# Patient Record
Sex: Male | Born: 1975 | Race: White | Hispanic: No | Marital: Single | Smoking: Never smoker
Health system: Southern US, Community
[De-identification: ages and names within clinical notes are randomized; demographics above are authoritative.]

---

## 2003-06-18 ENCOUNTER — Emergency Department (HOSPITAL_COMMUNITY): Admission: EM | Admit: 2003-06-18 | Discharge: 2003-06-18 | Payer: Self-pay | Admitting: Emergency Medicine

## 2013-04-08 ENCOUNTER — Emergency Department (HOSPITAL_BASED_OUTPATIENT_CLINIC_OR_DEPARTMENT_OTHER): Payer: Managed Care, Other (non HMO)

## 2013-04-08 ENCOUNTER — Encounter (HOSPITAL_BASED_OUTPATIENT_CLINIC_OR_DEPARTMENT_OTHER): Payer: Self-pay

## 2013-04-08 ENCOUNTER — Emergency Department (HOSPITAL_BASED_OUTPATIENT_CLINIC_OR_DEPARTMENT_OTHER)
Admission: EM | Admit: 2013-04-08 | Discharge: 2013-04-08 | Disposition: A | Discharge status: Home / Self Care | Payer: Managed Care, Other (non HMO) | Attending: Emergency Medicine | Admitting: Emergency Medicine

## 2013-04-08 DIAGNOSIS — R61 Generalized hyperhidrosis: Secondary | ICD-10-CM | POA: Insufficient documentation

## 2013-04-08 DIAGNOSIS — R55 Syncope and collapse: Secondary | ICD-10-CM | POA: Insufficient documentation

## 2013-04-08 DIAGNOSIS — R42 Dizziness and giddiness: Secondary | ICD-10-CM | POA: Insufficient documentation

## 2013-04-08 DIAGNOSIS — R110 Nausea: Secondary | ICD-10-CM | POA: Insufficient documentation

## 2013-04-08 DIAGNOSIS — R404 Transient alteration of awareness: Secondary | ICD-10-CM | POA: Insufficient documentation

## 2013-04-08 LAB — CBC WITH DIFFERENTIAL/PLATELET
Basophils Relative: 1 % (ref 0–1)
HCT: 44.4 % (ref 39.0–52.0)
Hemoglobin: 15.5 g/dL (ref 13.0–17.0)
Lymphs Abs: 1.9 10*3/uL (ref 0.7–4.0)
MCHC: 34.9 g/dL (ref 30.0–36.0)
Monocytes Absolute: 0.6 10*3/uL (ref 0.1–1.0)
Monocytes Relative: 6 % (ref 3–12)
Neutro Abs: 6.8 10*3/uL (ref 1.7–7.7)
RBC: 5 MIL/uL (ref 4.22–5.81)

## 2013-04-08 LAB — BASIC METABOLIC PANEL
BUN: 15 mg/dL (ref 6–23)
CO2: 27 mEq/L (ref 19–32)
Chloride: 99 mEq/L (ref 96–112)
Creatinine, Ser: 1 mg/dL (ref 0.50–1.35)
GFR calc Af Amer: >90 mL/min (ref >90)
Glucose, Bld: 93 mg/dL (ref 70–99)

## 2013-04-08 LAB — RAPID URINE DRUG SCREEN, HOSP PERFORMED
Amphetamines: NOT DETECTED
Barbiturates: NOT DETECTED
Benzodiazepines: NOT DETECTED
Tetrahydrocannabinol: NOT DETECTED

## 2013-04-08 LAB — POCT I-STAT TROPONIN I: Troponin i, poc: 0 ng/mL (ref 0.00–0.08)

## 2013-04-08 MED ORDER — ONDANSETRON HCL 4 MG/2ML IJ SOLN
4.0000 mg | Freq: Once | INTRAMUSCULAR | Status: AC
  Administered 2013-04-08: 4 mg via INTRAVENOUS

## 2013-04-08 MED ORDER — ONDANSETRON HCL 4 MG/2ML IJ SOLN
INTRAMUSCULAR | Status: AC
  Filled 2013-04-08: qty 2

## 2013-04-08 MED ORDER — KETOROLAC TROMETHAMINE 30 MG/ML IJ SOLN
30.0000 mg | Freq: Once | INTRAMUSCULAR | Status: AC
  Administered 2013-04-08: 30 mg via INTRAVENOUS
  Filled 2013-04-08: qty 1

## 2013-04-08 NOTE — ED Notes (Signed)
Pt reports he developed a headache, nausea and had a 10 second syncopal episode while a work today.

## 2013-04-08 NOTE — ED Provider Notes (Signed)
History     CSN: [id number]  Arrival date & time 04/08/13  1258   First MD Initiated Contact with Patient 04/08/13 1309      Chief Complaint  Patient presents with  . Loss of Consciousness  . Headache    (Consider location/radiation/quality/duration/timing/severity/associated sxs/prior treatment) HPI Comments: Pt states that he was at work and got dizzy, nauseated and diaphoretic and he called a co-worker over and he was told by the co worker that he was out for about 10 seconds;no previous history of similar symptoms:pt states that he has chills not and still some nausea, but feels okay  Patient is a 37 y.o. male presenting with syncope. The history is provided by the patient. No language interpreter was used.  Loss of Consciousness Episode history:  Single Most recent episode:  Today Timing:  Intermittent Progression:  Resolved Chronicity:  New Context: sitting down   Witnessed: yes   Relieved by:  Nothing Worsened by:  Nothing tried Associated symptoms: diaphoresis, dizziness, headaches and nausea   Associated symptoms: no chest pain, no fever, no focal weakness and no shortness of breath     History reviewed. No pertinent past medical history.  History reviewed. No pertinent past surgical history.  No family history on file.  History  Substance Use Topics  . Smoking status: Never Smoker   . Smokeless tobacco: Not on file  . Alcohol Use: Yes     Comment: moderately      Review of Systems  Constitutional: Positive for diaphoresis. Negative for fever.  Respiratory: Negative for shortness of breath.   Cardiovascular: Positive for syncope. Negative for chest pain.  Gastrointestinal: Positive for nausea.  Neurological: Positive for dizziness and headaches. Negative for focal weakness.    Allergies  Review of patient's allergies indicates no known allergies.  Home Medications   Current Outpatient Rx  Name  Route  Sig  Dispense  Refill  . Desloratadine  (CLARINEX PO)   Oral   Take by mouth.         . Multiple Vitamins-Minerals (MULTIVITAMIN PO)   Oral   Take by mouth.           BP 134/87  Pulse 72  Temp(Src) 98.5 F (36.9 C) (Oral)  Resp 18  Ht 5\' 8"  (1.727 m)  Wt 168 lb (76.204 kg)  BMI 25.55 kg/m2  SpO2 100%  Physical Exam  Nursing note and vitals reviewed. Constitutional: He is oriented to person, place, and time. He appears well-developed and well-nourished.  HENT:  Head: Normocephalic and atraumatic.  Eyes: Conjunctivae and EOM are normal. Pupils are equal, round, and reactive to light.  Neck: Normal range of motion. Neck supple.  Cardiovascular: Normal rate and regular rhythm.   Pulmonary/Chest: Effort normal and breath sounds normal.  Abdominal: Soft. Bowel sounds are normal. There is no tenderness.  Musculoskeletal: Normal range of motion.  Neurological: He is alert and oriented to person, place, and time. Coordination normal.  Skin: Skin is warm and dry.  Psychiatric: He has a normal mood and affect.    ED Course  Procedures (including critical care time)  Labs Reviewed  CBC WITH DIFFERENTIAL  BASIC METABOLIC PANEL  URINE RAPID DRUG SCREEN (HOSP PERFORMED)  POCT I-STAT TROPONIN I   Dg Chest 2 View  04/08/2013   *RADIOLOGY REPORT*  Clinical Data: Syncope  CHEST - 2 VIEW  Comparison: None.  Findings: Lungs are essentially clear.  No focal consolidation. No pleural effusion or pneumothorax.  The heart is  normal in size.  Visualized osseous structures are within normal limits.  IMPRESSION: No evidence of acute cardiopulmonary disease.   Original Report Authenticated By: Charline Bills, M.D.    Date: 04/08/2013  Rate: 77  Rhythm: normal sinus rhythm  QRS Axis: normal  Intervals: normal  ST/T Wave abnormalities: normal  Conduction Disutrbances:none  Narrative Interpretation:   Old EKG Reviewed: none available   1. Syncope       MDM  Pt is feeling better at this time:think it is okay for pt to  go home        Teressa Lower, NP 04/08/13 1636  Hilario Quarry, MD 04/09/13 1101

## 2013-07-10 ENCOUNTER — Other Ambulatory Visit: Payer: Self-pay | Admitting: Physician Assistant

## 2013-07-10 ENCOUNTER — Ambulatory Visit
Admission: RE | Admit: 2013-07-10 | Discharge: 2013-07-10 | Disposition: A | Discharge status: Home / Self Care | Payer: Managed Care, Other (non HMO) | Source: Ambulatory Visit | Attending: Physician Assistant | Admitting: Physician Assistant

## 2013-07-10 DIAGNOSIS — M542 Cervicalgia: Secondary | ICD-10-CM

## 2014-06-24 IMAGING — CT CT HEAD W/O CM
1 series · 16 of 30 positions shown, 20 images · non-contrast
Comparison: None.

CLINICAL DATA: Headache, loss of consciousness

CT HEAD WITHOUT CONTRAST
TECHNIQUE: Contiguous axial images were obtained from the base of
the skull through the vertex without contrast

[Series 2: head 4.8 h37s · axial · 0.42mm/px · z∈[+1107,+1259]mm · 16 of 36 slices shown, 20 images]
[im 2/36  brain]
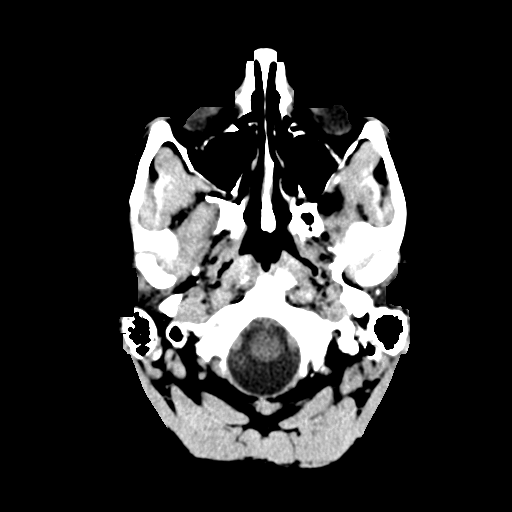
[im 2/36  bone]
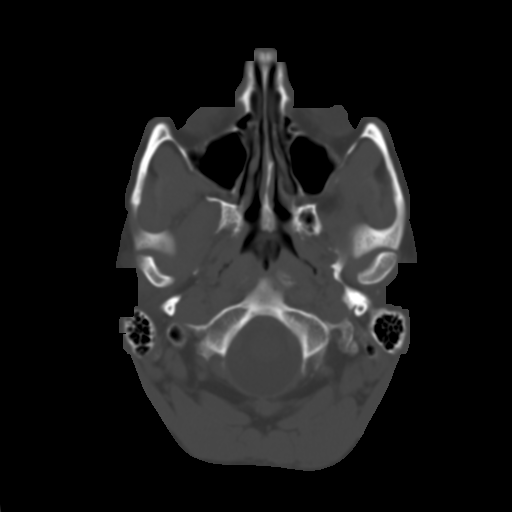
[im 4/36  brain]
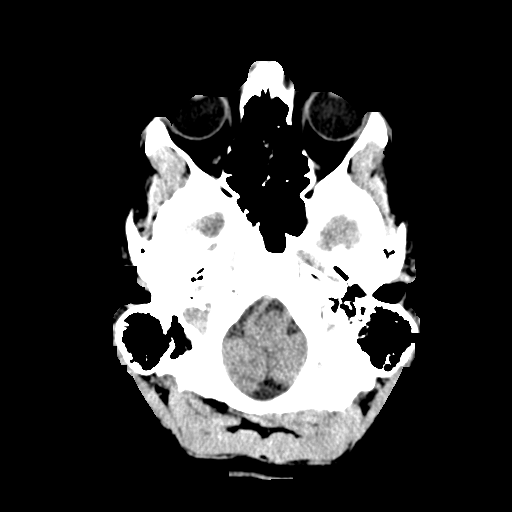
[im 7/36  brain]
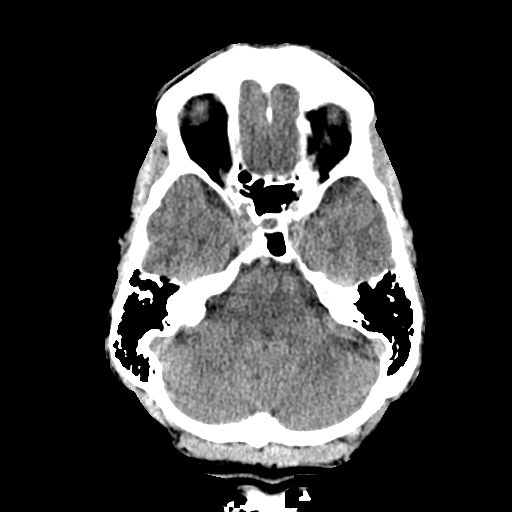
[im 9/36  brain]
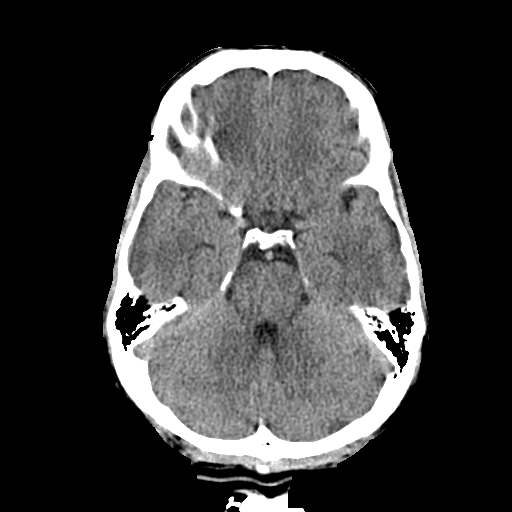
[im 10/36  brain]
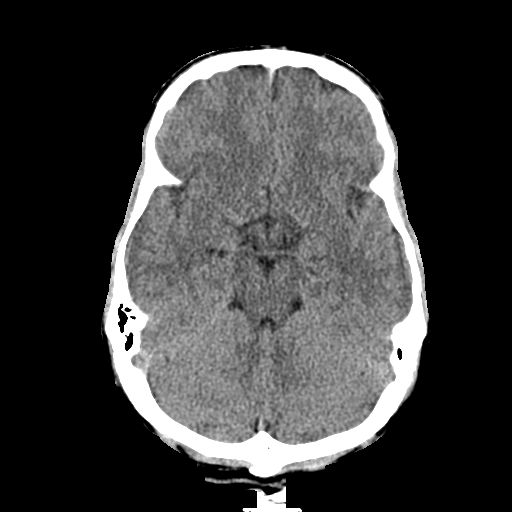
[im 10/36  bone]
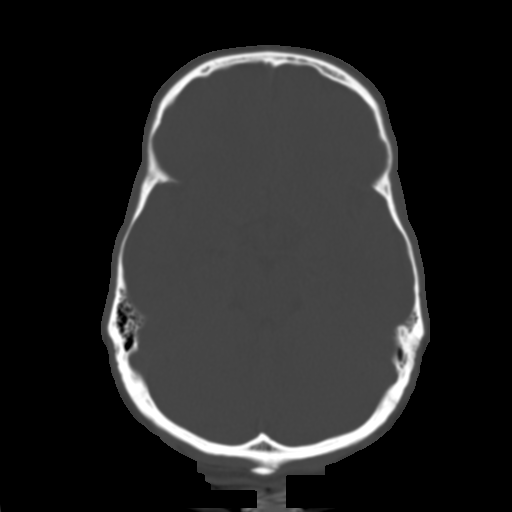
[im 13/36  brain]
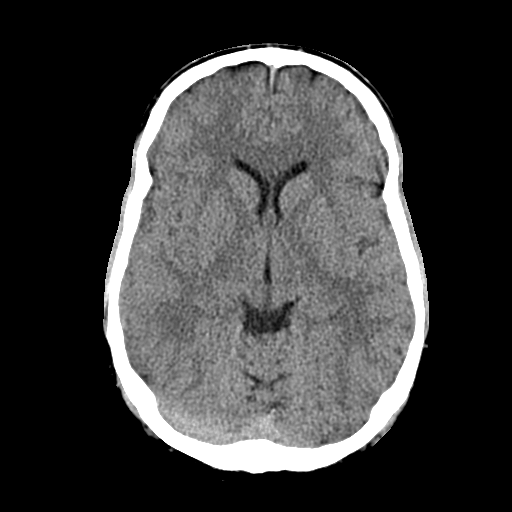
[im 15/36  brain]
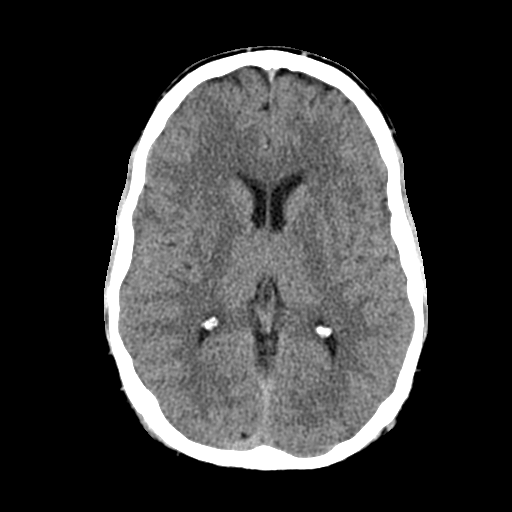
[im 17/36  brain]
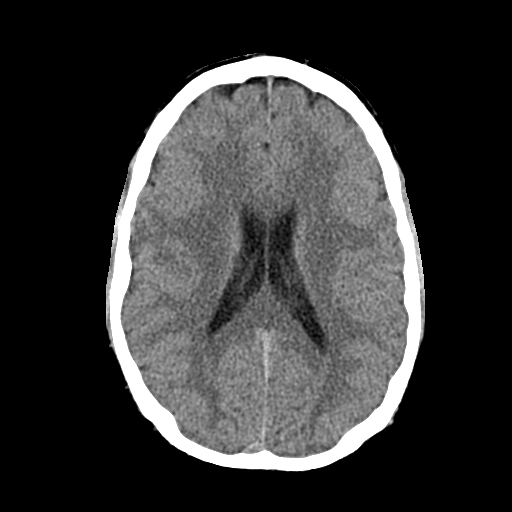
[im 19/36  brain]
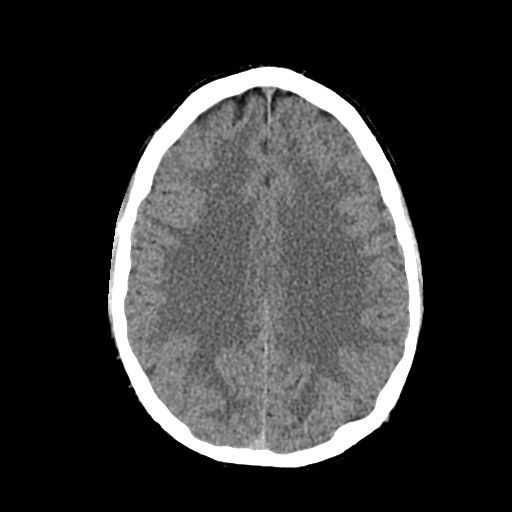
[im 19/36  bone]
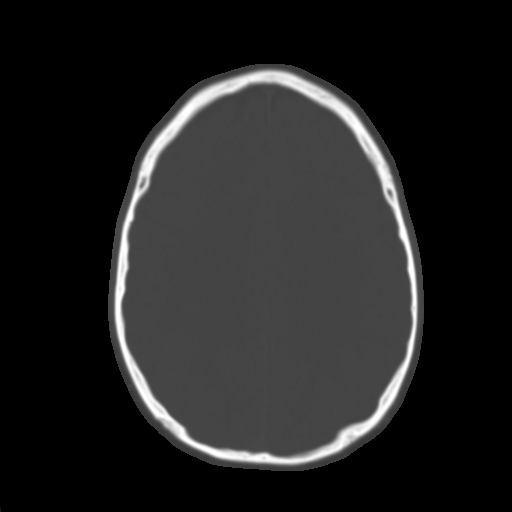
[im 21/36  brain]
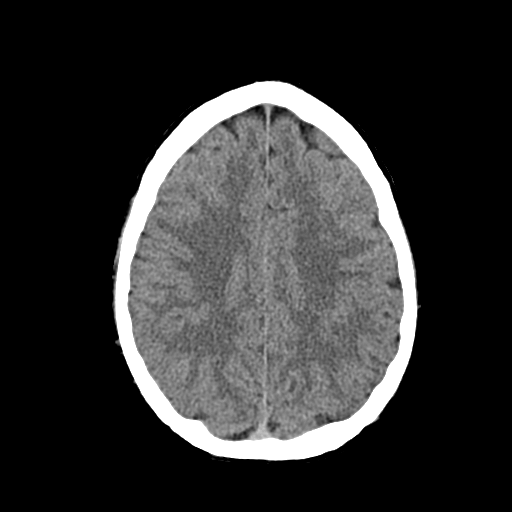
[im 23/36  brain]
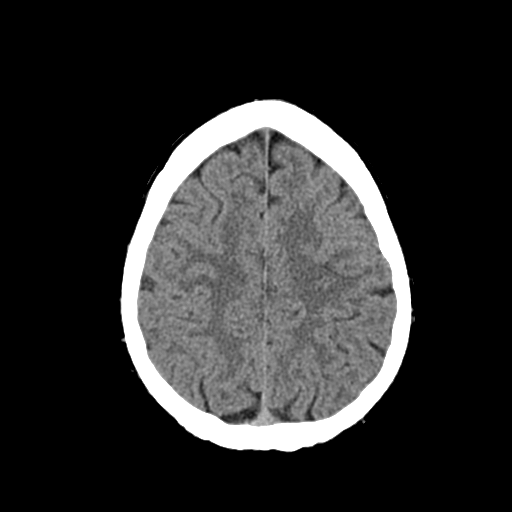
[im 26/36  brain]
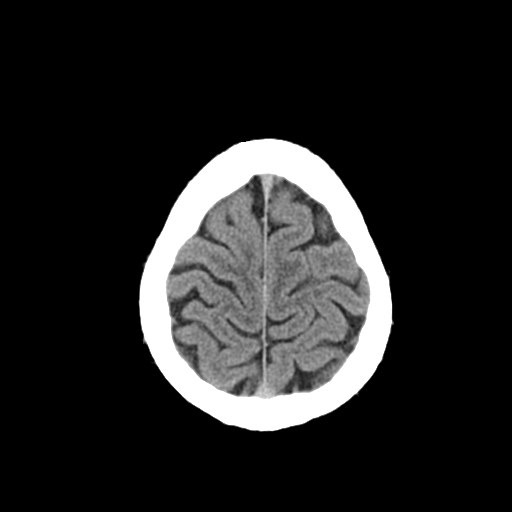
[im 27/36  brain]
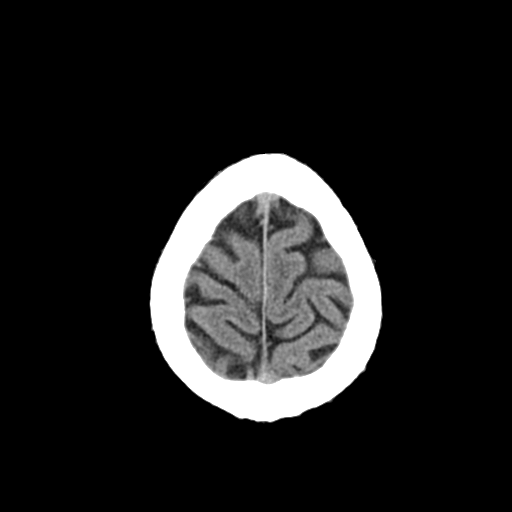
[im 27/36  bone]
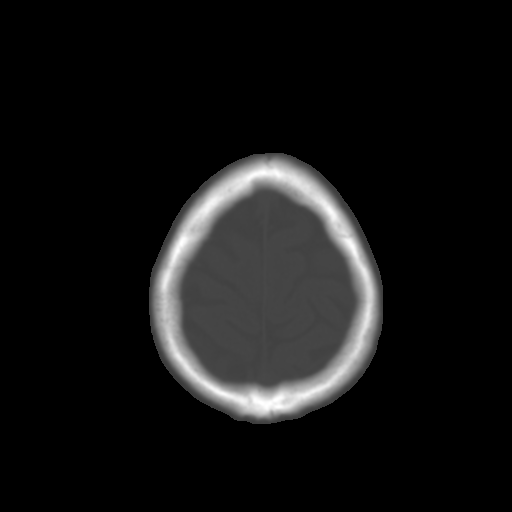
[im 29/36  brain]
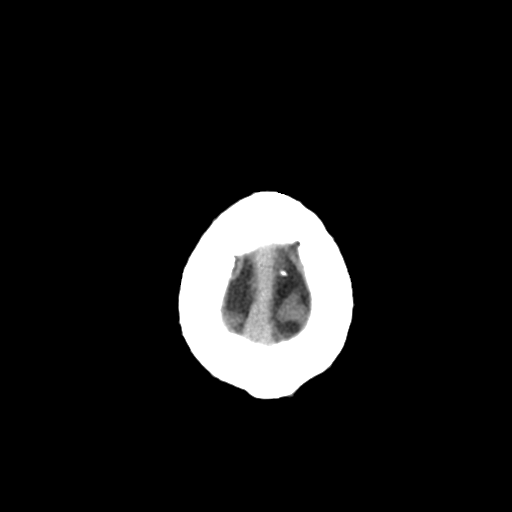
[im 32/36  brain]
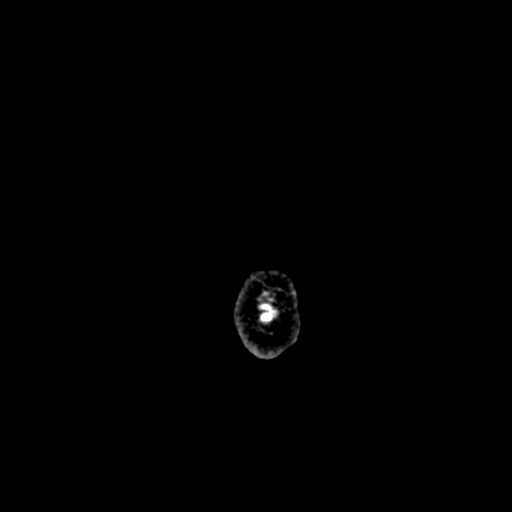
[im 34/36  brain]
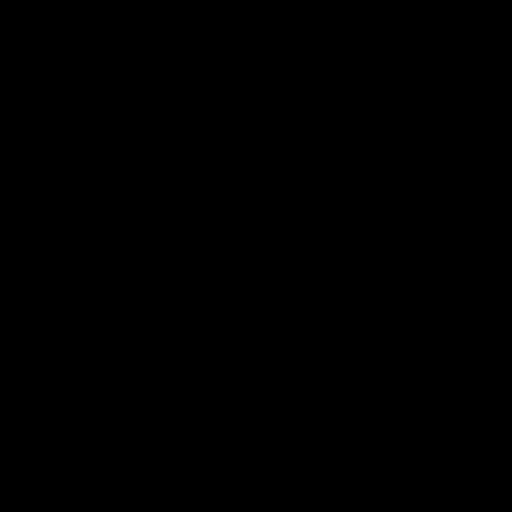

[16 of 30 positions shown; findings below may reference images not displayed]

FINDINGS: The brain has a normal appearance without evidence for
hemorrhage, acute infarction, hydrocephalus, or mass lesion.  There
is no extra axial fluid collection.  The skull and paranasal
sinuses are normal.
IMPRESSION: Normal CT of the head without contrast.

## 2014-06-24 IMAGING — CR DG CHEST 2V
2 series · 2 of 2 positions shown · non-contrast
Comparison: None.

CLINICAL DATA: Syncope

CHEST - 2 VIEW

[w chest pa]
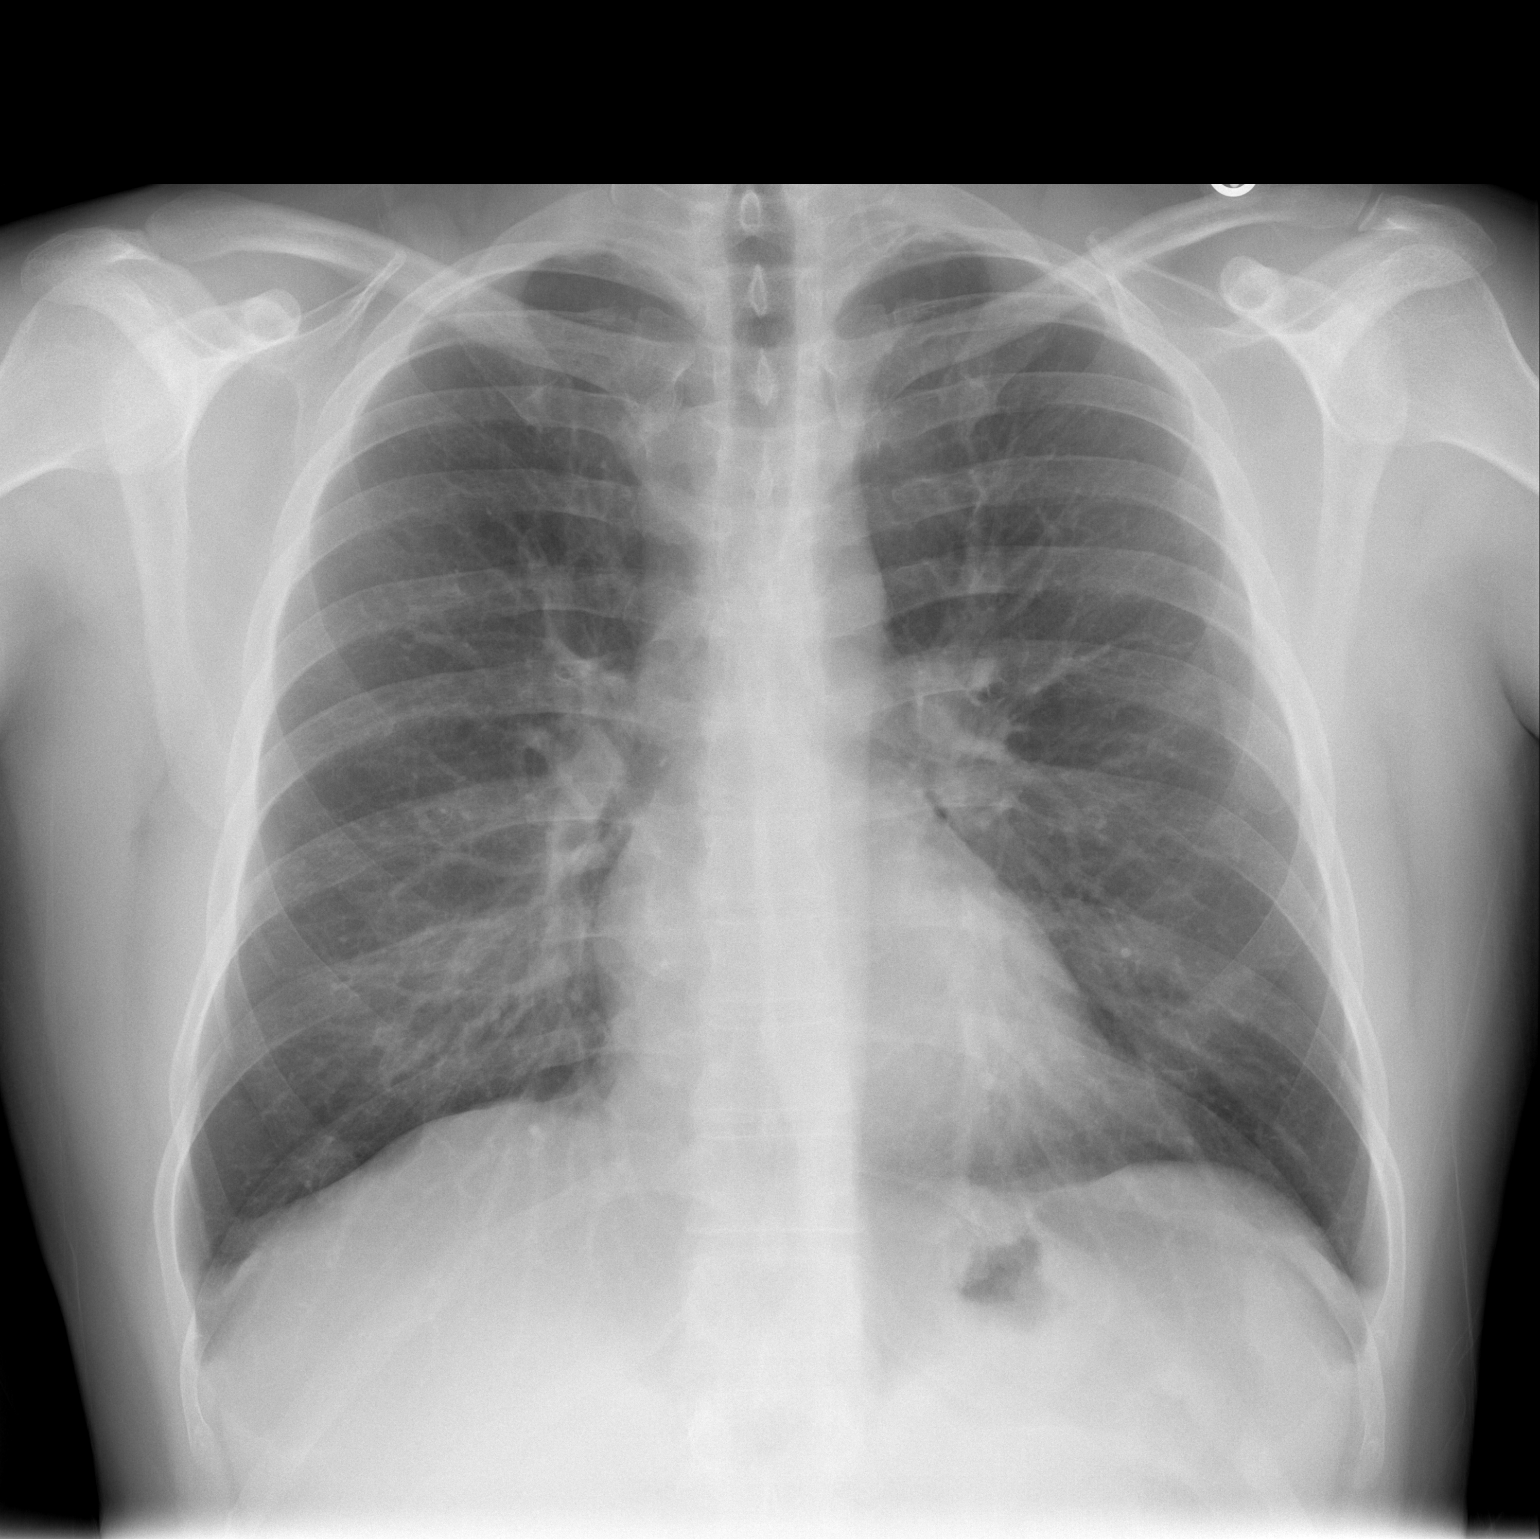

[w chest lat]
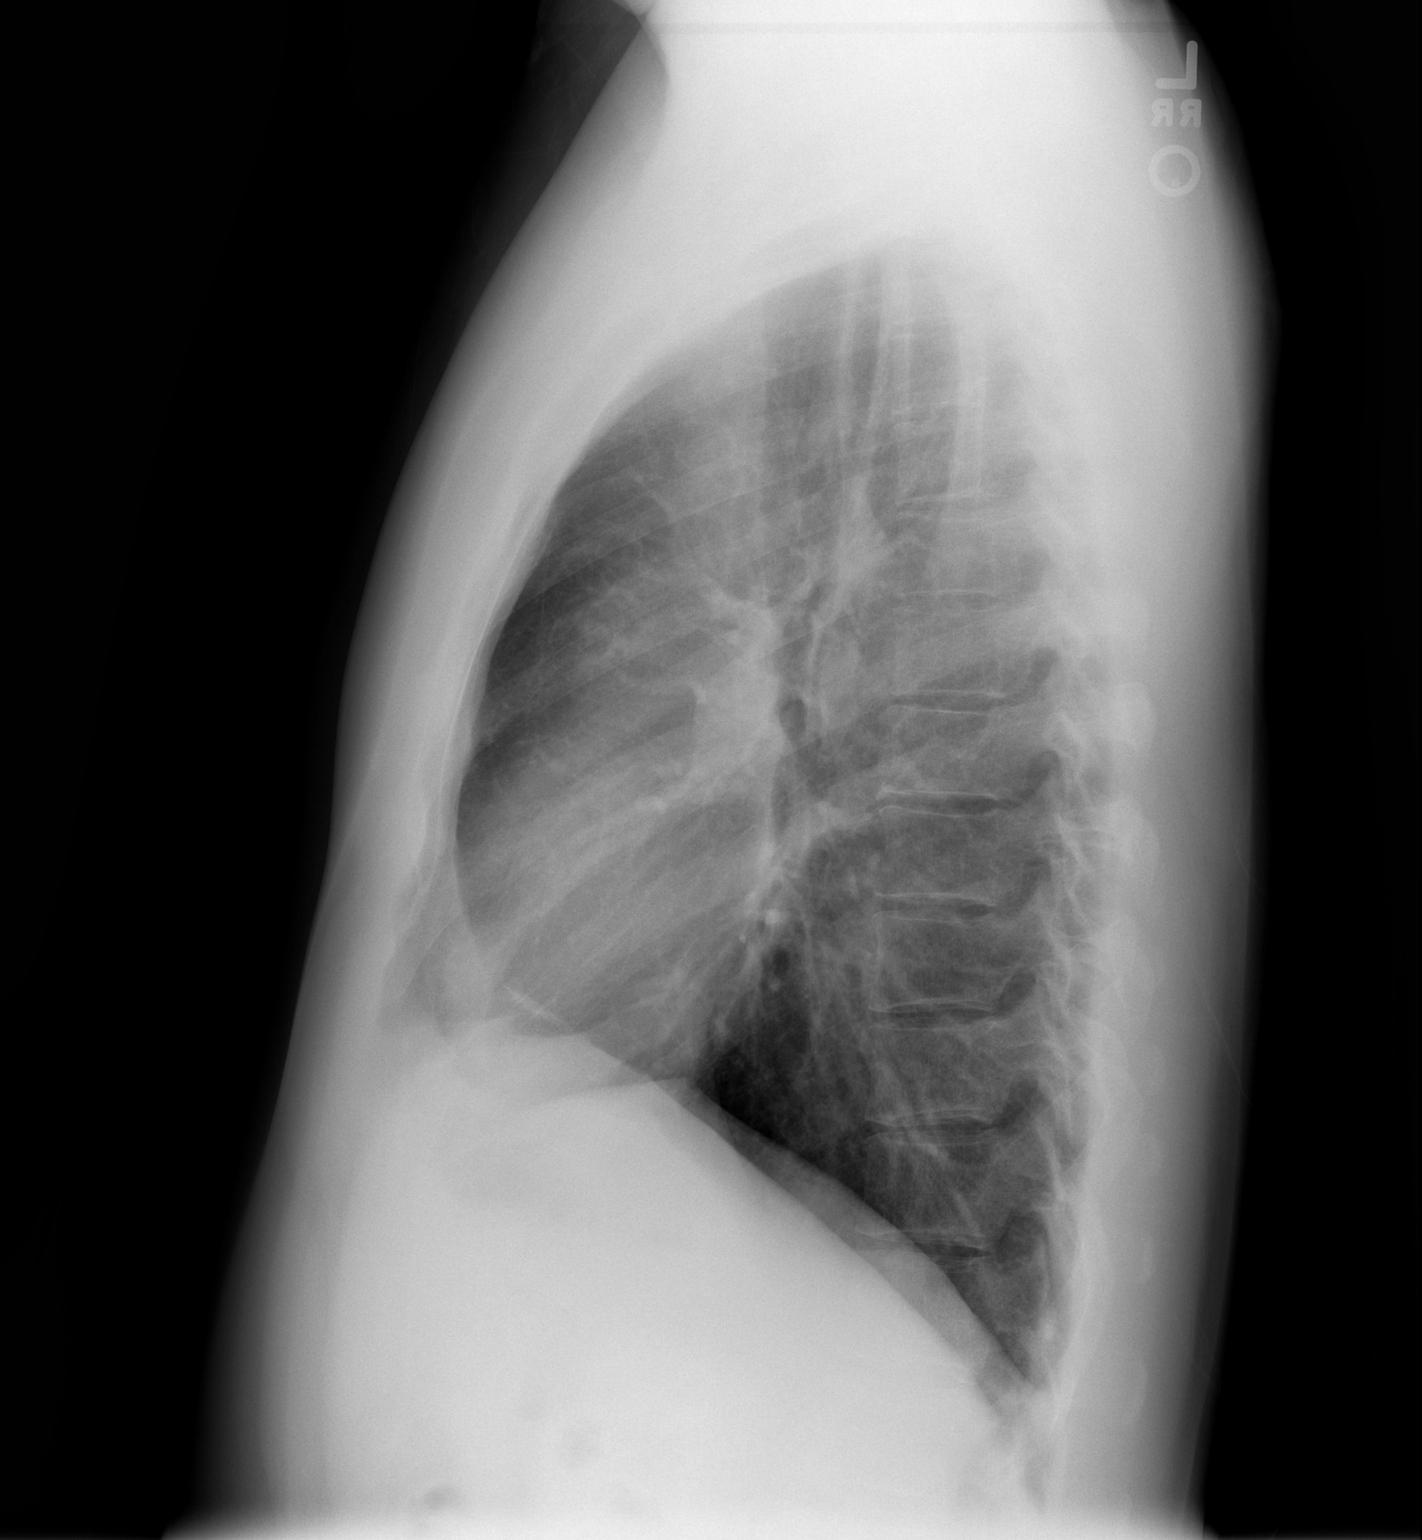

[2 of 2 positions shown; findings below may reference images not displayed]

FINDINGS: Lungs are essentially clear.  No focal consolidation. No
pleural effusion or pneumothorax.

The heart is normal in size.

Visualized osseous structures are within normal limits.
IMPRESSION: No evidence of acute cardiopulmonary disease.

## 2022-05-25 ENCOUNTER — Ambulatory Visit: Payer: 59 | Admitting: Podiatry

## 2022-05-25 ENCOUNTER — Ambulatory Visit (INDEPENDENT_AMBULATORY_CARE_PROVIDER_SITE_OTHER): Payer: 59

## 2022-05-25 DIAGNOSIS — M79674 Pain in right toe(s): Secondary | ICD-10-CM

## 2022-05-25 DIAGNOSIS — M79671 Pain in right foot: Secondary | ICD-10-CM

## 2022-05-25 DIAGNOSIS — G8929 Other chronic pain: Secondary | ICD-10-CM

## 2022-05-25 DIAGNOSIS — M2031 Hallux varus (acquired), right foot: Secondary | ICD-10-CM

## 2022-05-28 NOTE — Progress Notes (Signed)
Subjective:   Patient ID: Benjamin Greene, male   DOB: 46 y.o.   MRN: [id number]   HPI 46 year old male presents the office with concerns of right big toe swelling, discomfort.  This started about 1 and half years ago.  Since then not on the top of the big toe as well as the bottom of the big toe.  Denies any recent injury.  He had no recent treatment.   Review of Systems  All other systems reviewed and are negative.  No past medical history on file.  No past surgical history on file.   Current Outpatient Medications:    diazepam (VALIUM) 5 MG tablet, 1/2 - 1  tablet, Disp: , Rfl:    escitalopram (LEXAPRO) 10 MG tablet, 1 tablet, Disp: , Rfl:    Desloratadine (CLARINEX PO), Take by mouth., Disp: , Rfl:    ezetimibe (ZETIA) 10 MG tablet, 1 tablet, Disp: , Rfl:    loratadine (CLARITIN) 10 MG tablet, Take by mouth., Disp: , Rfl:    Melatonin 10 MG TABS, 1 tablet, Disp: , Rfl:    Multiple Vitamin (M.V.I. ADULT IV), , Disp: , Rfl:    Multiple Vitamins-Minerals (MULTIVITAMIN PO), Take by mouth., Disp: , Rfl:    naproxen sodium (ALEVE) 220 MG tablet, 1 tablet with food or milk as needed, Disp: , Rfl:    valACYclovir (VALTREX) 500 MG tablet, 1 tablet, Disp: , Rfl:   Allergies  Allergen Reactions   Atorvastatin Other (See Comments)   Citalopram Other (See Comments)   Crestor [Rosuvastatin] Other (See Comments)           Objective:  Physical Exam  General: AAO x3, NAD  Dermatological: Skin is warm, dry and supple bilateral. . There are no open sores, no preulcerative lesions, no rash or signs of infection present.  Vascular: Dorsalis Pedis artery and Posterior Tibial artery pedal pulses are 2/4 bilateral with immedate capillary fill time. There is no pain with calf compression, swelling, warmth, erythema.   Neruologic: Grossly intact via light touch bilateral.   Musculoskeletal: Decreased range of motion noted along the hallux IPJ.  There is hallux malleus clinically present.   There is tenderness palpation on the dorsal aspect IPJ but also on the plantar aspect of the toe.  There is no soft tissue mass identified there is no open lesion or callus formation.  Muscular strength 5/5 in all groups tested bilateral.  Gait: Unassisted, Nonantalgic.       Assessment:   Arthritic changes present right hallux IPJ, hallux malleus     Plan:  -Treatment options discussed including all alternatives, risks, and complications -Etiology of symptoms were discussed -X-rays were obtained and reviewed with the patient.  3 views of the right foot were obtained.  No evidence of acute fracture noted. -Discussed surgical versus conservative care. -Clinically symptoms are more of a hallux malleus, decreased range of motion of the IPJ which is likely resulting in the pain as well as the area plantarly.  Discussed course of physical she has been dispensed a graphite insert.  Discussed topical medication as well such as Voltaren gel.    Vivi Barrack DPM

## 2022-06-09 ENCOUNTER — Other Ambulatory Visit: Payer: Self-pay | Admitting: Podiatry

## 2022-06-09 DIAGNOSIS — M2031 Hallux varus (acquired), right foot: Secondary | ICD-10-CM

## 2022-11-07 ENCOUNTER — Ambulatory Visit: Payer: 59 | Admitting: Podiatry

## 2022-11-07 DIAGNOSIS — M7751 Other enthesopathy of right foot: Secondary | ICD-10-CM | POA: Diagnosis not present

## 2022-11-07 DIAGNOSIS — M10071 Idiopathic gout, right ankle and foot: Secondary | ICD-10-CM

## 2022-11-07 MED ORDER — COLCHICINE 0.6 MG PO TABS: 0.6000 mg | ORAL_TABLET | Freq: Every day | ORAL | 0 refills | Status: AC

## 2022-11-07 NOTE — Progress Notes (Signed)
Subjective:  Patient ID: Benjamin Greene, male    DOB: 1976/11/05,  MRN: [id number]  Chief Complaint  Patient presents with   Toe Pain    Right toe pain Pt stated that he was seen for this year for his toe and it has not got any better     46 y.o. male presents with the above complaint.  Patient presents with right hallux IPJ red hot swollen joint.  Patient states very painful has progressive gotten worse nothing has helped.  He would like to discuss treatment options for it.  He is known to Dr. Loreta Ave who saw him in the past.  Hurts with ambulation worse with pressure pain scale 7 out of 10 sharp shooting in nature.   Review of Systems: Negative except as noted in the HPI. Denies N/V/F/Ch.  No past medical history on file.  Current Outpatient Medications:    colchicine 0.6 MG tablet, Take 1 tablet (0.6 mg total) by mouth daily., Disp: 60 tablet, Rfl: 0   Desloratadine (CLARINEX PO), Take by mouth., Disp: , Rfl:    diazepam (VALIUM) 5 MG tablet, 1/2 - 1  tablet, Disp: , Rfl:    escitalopram (LEXAPRO) 10 MG tablet, 1 tablet, Disp: , Rfl:    ezetimibe (ZETIA) 10 MG tablet, 1 tablet, Disp: , Rfl:    loratadine (CLARITIN) 10 MG tablet, Take by mouth., Disp: , Rfl:    Melatonin 10 MG TABS, 1 tablet, Disp: , Rfl:    Multiple Vitamin (M.V.I. ADULT IV), , Disp: , Rfl:    Multiple Vitamins-Minerals (MULTIVITAMIN PO), Take by mouth., Disp: , Rfl:    naproxen sodium (ALEVE) 220 MG tablet, 1 tablet with food or milk as needed, Disp: , Rfl:    valACYclovir (VALTREX) 500 MG tablet, 1 tablet, Disp: , Rfl:   Social History   Tobacco Use  Smoking Status Never  Smokeless Tobacco Not on file    Allergies  Allergen Reactions   Atorvastatin Other (See Comments)   Citalopram Other (See Comments)   Crestor [Rosuvastatin] Other (See Comments)   Objective:  There were no vitals filed for this visit. There is no height or weight on file to calculate BMI. Constitutional Well developed. Well  nourished.  Vascular Dorsalis pedis pulses palpable bilaterally. Posterior tibial pulses palpable bilaterally. Capillary refill normal to all digits.  No cyanosis or clubbing noted. Pedal hair growth normal.  Neurologic Normal speech. Oriented to person, place, and time. Epicritic sensation to light touch grossly present bilaterally.  Dermatologic Nails well groomed and normal in appearance. No open wounds. No skin lesions.  Orthopedic: Pain on palpation to the right hallux interphalangeal joint.  Red hot swollen joint noted.  Pain with range of motion of the joint.  No pain at the metatarsophalangeal joint.   Radiographs: None Assessment:   1. Capsulitis of toe, right   2. Acute idiopathic gout involving toe of right foot    Plan:  Patient was evaluated and treated and all questions answered.  Right hallux IPJ capsulitis with underlying gout flare -All questions and concerns were discussed with the patient in extensive detail.  Given the amount of pain that he is having he will benefit from steroid injection to help decrease acute inflammatory component associate with pain.  Patient agrees with the plan like to proceed with steroid injection -A steroid injection was performed at right hallux IPJ using 1% plain Lidocaine and 10 mg of Kenalog. This was well tolerated. -Colchicine was also dispensed for pain  control -I discussed diet management as well

## 2022-11-10 ENCOUNTER — Ambulatory Visit: Payer: 59 | Admitting: Podiatry

## 2022-11-30 ENCOUNTER — Other Ambulatory Visit: Payer: Self-pay | Admitting: Podiatry

## 2023-06-12 DIAGNOSIS — G5620 Lesion of ulnar nerve, unspecified upper limb: Secondary | ICD-10-CM | POA: Insufficient documentation

## 2023-06-12 DIAGNOSIS — M79645 Pain in left finger(s): Secondary | ICD-10-CM | POA: Insufficient documentation

## 2023-09-12 ENCOUNTER — Other Ambulatory Visit: Payer: Self-pay | Admitting: Internal Medicine

## 2023-09-12 DIAGNOSIS — E230 Hypopituitarism: Secondary | ICD-10-CM

## 2023-10-01 ENCOUNTER — Encounter: Payer: Self-pay | Admitting: Internal Medicine

## 2023-10-04 ENCOUNTER — Other Ambulatory Visit: Payer: 59

## 2023-11-13 ENCOUNTER — Telehealth: Payer: Self-pay

## 2023-11-13 MED ORDER — COLCHICINE 0.6 MG PO TABS: 0.6000 mg | ORAL_TABLET | Freq: Two times a day (BID) | ORAL | 0 refills | Status: DC

## 2023-11-13 NOTE — Telephone Encounter (Signed)
Patient had tried to get a refill of colchicine, he is having a gout flare up.It's been over a year since he was seen in our office.  He has scheduled an appointment for 11/19/23 (first Available) Can we send in colchicine to help him in the meantime?

## 2023-11-13 NOTE — Addendum Note (Signed)
 Addended by: Lucia Estelle D on: 11/13/2023 08:32 PM   Modules accepted: Orders

## 2023-11-19 ENCOUNTER — Ambulatory Visit (INDEPENDENT_AMBULATORY_CARE_PROVIDER_SITE_OTHER): Payer: 59

## 2023-11-19 ENCOUNTER — Encounter: Payer: Self-pay | Admitting: Podiatry

## 2023-11-19 ENCOUNTER — Ambulatory Visit: Payer: 59 | Admitting: Podiatry

## 2023-11-19 DIAGNOSIS — M10071 Idiopathic gout, right ankle and foot: Secondary | ICD-10-CM

## 2023-11-19 MED ORDER — COLCHICINE 0.6 MG PO TABS: 0.6000 mg | ORAL_TABLET | Freq: Two times a day (BID) | ORAL | 0 refills | Status: AC

## 2023-11-19 NOTE — Progress Notes (Signed)
      Chief Complaint  Patient presents with   Toe Pain    Hallux right - redness, aching at the IP joint x 2 weeks, patient had called office and was sent in colchicine , already feeling better   HPI: 48 y.o. male presents today after calling the office on January 2nd concerned about a possible gout attack to the right great toe joint.  This message have been routed to me and I had sent in colchicine  0.6 mg for the patient to take 1 tablet twice daily until the gout attack resolves.  He was scheduled for follow-up today.  He notes that he feels that he is on the tail end of this gout attack and is doing much better than he was last week.  Denies any history of gout in his family.  He notes this is the second painful gout attack in the past few years.  Denies any kidney dysfunction  History reviewed. No pertinent past medical history.  History reviewed. No pertinent surgical history.  Allergies  Allergen Reactions   Atorvastatin Other (See Comments)   Citalopram Other (See Comments)   Crestor [Rosuvastatin] Other (See Comments)    Review of Systems  Musculoskeletal:  Positive for joint pain.     Physical Exam: There are palpable pedal pulses to the right foot.  Minimal edema and erythema noted to the right hallux first IP joint.  Minimal discomfort to the area at this time.  No open lesions.  Radiographic Exam (right foot, 3 weightbearing views, 09/18/2024):  Normal osseous mineralization. Joint spaces preserved.  No fractures or osseous irregularities noted.  Slight increase in soft tissue density noted on the dorsal medial aspect of the first IP joint  Assessment/Plan of Care: 1. Acute idiopathic gout involving toe of right foot      Meds ordered this encounter  Medications   colchicine  0.6 MG tablet    Sig: Take 1 tablet (0.6 mg total) by mouth 2 (two) times daily. Stop taking the medication once your symptoms improve.    Dispense:  60 tablet    Refill:  0   Discussed  clinical and radiographic findings with patient today.  Additional colchicine  was prescribed today so that he can have on hand if he has any other gout attacks in the near future.  If these gout attacks become more frequent, he may need to start with allopurinol to help with prevention.  He will continue with the tart cherry juice as well.  I he was pleased that he did not need to get a cortisone injection today, stating that that was the most painful injection he ever had when he received one on a previous visit with one of our physicians.   Follow-up as needed   Awanda CHARM Imperial, DPM, FACFAS [facility]     [address] [place], [place] [address]                Office [phone]  Fax [phone]

## 2023-12-06 ENCOUNTER — Other Ambulatory Visit: Payer: Self-pay | Admitting: Podiatry

## 2024-11-10 ENCOUNTER — Other Ambulatory Visit (HOSPITAL_BASED_OUTPATIENT_CLINIC_OR_DEPARTMENT_OTHER): Payer: Self-pay | Admitting: Physician Assistant

## 2024-11-10 DIAGNOSIS — E7800 Pure hypercholesterolemia, unspecified: Secondary | ICD-10-CM

## 2024-11-20 ENCOUNTER — Ambulatory Visit (HOSPITAL_BASED_OUTPATIENT_CLINIC_OR_DEPARTMENT_OTHER)
Admission: RE | Admit: 2024-11-20 | Discharge: 2024-11-20 | Disposition: A | Discharge status: Home / Self Care | Payer: Self-pay | Source: Ambulatory Visit | Attending: Physician Assistant | Admitting: Physician Assistant

## 2024-11-20 DIAGNOSIS — E7800 Pure hypercholesterolemia, unspecified: Secondary | ICD-10-CM | POA: Insufficient documentation
